# Patient Record
Sex: Male | Born: 1995 | Hispanic: No | Marital: Single | Smoking: Never smoker
Health system: Southern US, Community
[De-identification: ages and names within clinical notes are randomized; demographics above are authoritative.]

## PROBLEM LIST (undated history)

## (undated) HISTORY — PX: APPENDECTOMY: SHX54

---
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
## 2015-03-15 ENCOUNTER — Emergency Department (HOSPITAL_COMMUNITY)
Admission: EM | Admit: 2015-03-15 | Discharge: 2015-03-16 | Disposition: A | Discharge status: Home / Self Care | Payer: Managed Care, Other (non HMO) | Attending: Emergency Medicine | Admitting: Emergency Medicine

## 2015-03-15 ENCOUNTER — Encounter (HOSPITAL_COMMUNITY): Payer: Self-pay | Admitting: Emergency Medicine

## 2015-03-15 DIAGNOSIS — Z9049 Acquired absence of other specified parts of digestive tract: Secondary | ICD-10-CM | POA: Diagnosis not present

## 2015-03-15 DIAGNOSIS — R197 Diarrhea, unspecified: Secondary | ICD-10-CM | POA: Diagnosis not present

## 2015-03-15 DIAGNOSIS — R1084 Generalized abdominal pain: Secondary | ICD-10-CM | POA: Insufficient documentation

## 2015-03-15 DIAGNOSIS — R112 Nausea with vomiting, unspecified: Secondary | ICD-10-CM | POA: Diagnosis present

## 2015-03-15 NOTE — ED Notes (Signed)
Pt states he has been having nausea, vomiting, and diarrhea since 2am yesterday  Pt states he is feeling light headed and having general body aches

## 2015-03-15 NOTE — ED Notes (Signed)
Pt states he went to the dr today and was given medication for the nausea and diarrhea  Pt states he took the phenergan an hour ago and the colestid at 7  Pt states he has had both diarrhea and vomiting since he took the medication

## 2015-03-16 ENCOUNTER — Emergency Department (HOSPITAL_COMMUNITY): Payer: Managed Care, Other (non HMO)

## 2015-03-16 ENCOUNTER — Encounter (HOSPITAL_COMMUNITY): Payer: Self-pay | Admitting: Emergency Medicine

## 2015-03-16 LAB — I-STAT CHEM 8, ED
BUN: 18 mg/dL (ref 6–20)
CALCIUM ION: 1.14 mmol/L (ref 1.12–1.23)
CREATININE: 0.9 mg/dL (ref 0.61–1.24)
Chloride: 101 mmol/L (ref 101–111)
Glucose, Bld: 110 mg/dL — ABNORMAL HIGH (ref 65–99)
HCT: 53 % — ABNORMAL HIGH (ref 39.0–52.0)
Hemoglobin: 18 g/dL — ABNORMAL HIGH (ref 13.0–17.0)
Potassium: 3.7 mmol/L (ref 3.5–5.1)
SODIUM: 137 mmol/L (ref 135–145)
TCO2: 23 mmol/L (ref 0–100)

## 2015-03-16 MED ORDER — ONDANSETRON 8 MG PO TBDP: ORAL_TABLET | ORAL | Status: DC

## 2015-03-16 MED ORDER — ONDANSETRON HCL 4 MG/2ML IJ SOLN
4.0000 mg | Freq: Once | INTRAMUSCULAR | Status: AC
  Administered 2015-03-16: 4 mg via INTRAVENOUS
  Filled 2015-03-16: qty 2

## 2015-03-16 MED ORDER — SODIUM CHLORIDE 0.9 % IV BOLUS (SEPSIS)
1000.0000 mL | Freq: Once | INTRAVENOUS | Status: AC
  Administered 2015-03-16: 1000 mL via INTRAVENOUS

## 2015-03-16 NOTE — ED Provider Notes (Signed)
CSN: [id number]     Arrival date & time 03/15/15  2224 History  By signing my name below, I, Soijett Blue, attest that this documentation has been prepared under the direction and in the presence of Fabianna Keats, MD. Electronically Signed: Soijett Blue, ED Scribe. 03/16/2015. 3:29 AM.  Chief Complaint  Patient presents with  . Emesis  . Diarrhea     Patient is a 20 y.o. male presenting with vomiting and diarrhea. The history is provided by the patient. No language interpreter was used.  Emesis Severity:  Moderate Duration:  3 days Timing:  Intermittent Quality:  Unable to specify Able to tolerate:  Liquids Progression:  Unchanged Chronicity:  New Recent urination:  Normal Context: not post-tussive and not self-induced   Relieved by:  Antiemetics Worsened by:  Nothing tried Ineffective treatments:  None tried Associated symptoms: abdominal pain and diarrhea   Abdominal pain:    Location:  Generalized   Quality:  Cramping   Severity:  Moderate   Timing:  Intermittent   Chronicity:  New Risk factors: sick contacts and suspect food intake   Diarrhea Quality:  Unable to specify Severity:  Moderate Onset quality:  Sudden Duration:  3 days Timing:  Intermittent Progression:  Unchanged Relieved by:  Anti-motility medications Worsened by:  Nothing tried Ineffective treatments:  None tried Associated symptoms: abdominal pain and vomiting   Risk factors: suspect food intake     HPI Comments: Eric Contreras is a 20 y.o. male who presents to the Emergency Department complaining of vomiting x 20 episodes onset 4 days ago. He reports that his symptoms began following eating lunch at work. He reports that his co-worker has similar symptoms due to eating the same food. He states that his last episode of vomiting and bowel movement was 4 hours ago. He states that he is having associated symptoms of nausea, diarrhea x 13 episodes, and intermittent sharp abdominal pain. He states that  he has tried Rx cholesterinum from a [facility] in [place], [place] with mild relief for his symptoms. He denies any other symptoms.   History reviewed. No pertinent past medical history. Past Surgical History  Procedure Laterality Date  . Appendectomy     Family History  Problem Relation Age of Onset  . Depression Mother   . Hypertension Father   . Hyperlipidemia Father   . Cancer Other   . Diabetes Other   . Heart attack Other    Social History  Substance Use Topics  . Smoking status: Never Smoker   . Smokeless tobacco: None  . Alcohol Use: No    Review of Systems  Gastrointestinal: Positive for vomiting, abdominal pain and diarrhea.  All other systems reviewed and are negative.     Allergies  Review of patient's allergies indicates no known allergies.  Home Medications   Prior to Admission medications   Not on File   BP 133/89 mmHg  Pulse 115  Temp(Src) 98.4 F (36.9 C) (Oral)  Resp 16  Ht 6\' 4"  (1.93 m)  Wt 182 lb (82.555 kg)  BMI 22.16 kg/m2  SpO2 100% Physical Exam  Constitutional: He is oriented to person, place, and time. He appears well-developed and well-nourished. No distress.  HENT:  Head: Normocephalic and atraumatic.  Mouth/Throat: Oropharynx is clear and moist and mucous membranes are normal.  Eyes: EOM are normal. Pupils are equal, round, and reactive to light.  Neck: Normal range of motion. Neck supple.  Cardiovascular: Normal rate, regular rhythm and  normal heart sounds.  Exam reveals no gallop and no friction rub.   No murmur heard. Pulmonary/Chest: Effort normal and breath sounds normal. No respiratory distress. He has no wheezes. He has no rales.  Abdominal: Soft. He exhibits no distension and no mass. Bowel sounds are increased. There is no tenderness. There is no rebound and no guarding.  Musculoskeletal: Normal range of motion.  Neurological: He is alert and oriented to person, place, and time.  Skin: Skin is warm  and dry.  Psychiatric: He has a normal mood and affect. His behavior is normal.  Nursing note and vitals reviewed.   ED Course  Procedures (including critical care time) DIAGNOSTIC STUDIES: Oxygen Saturation is 100% on RA, nl by my interpretation.    COORDINATION OF CARE: 3:26 AM Discussed treatment plan with pt at bedside and pt agreed to plan.    Labs Review Labs Reviewed  I-STAT CHEM 8, ED    Imaging Review No results found. I have personally reviewed and evaluated these images and lab results as part of my medical decision-making.   EKG Interpretation None      MDM   Final diagnoses:  None    Feels better post medication.  No emesis or diarrhea in the department.  Po challenged successfully.  Exam and vitals are benign and reassuring.  Zofran ODT and bland diet.  Strict abdominal pain return precautions given.    I personally performed the services described in this documentation, which was scribed in my presence. The recorded information has been reviewed and is accurate.      Cy Blamer, MD 03/16/15 [id number]

## 2016-12-01 IMAGING — CR DG ABDOMEN ACUTE W/ 1V CHEST
4 series · 4 of 4 positions shown · non-contrast
Comparison: None.

CLINICAL DATA: Vomiting and diarrhea for 1 week. Mid upper
abdominal cramping.

EXAM:
DG ABDOMEN ACUTE W/ 1V CHEST

[w chest pa]
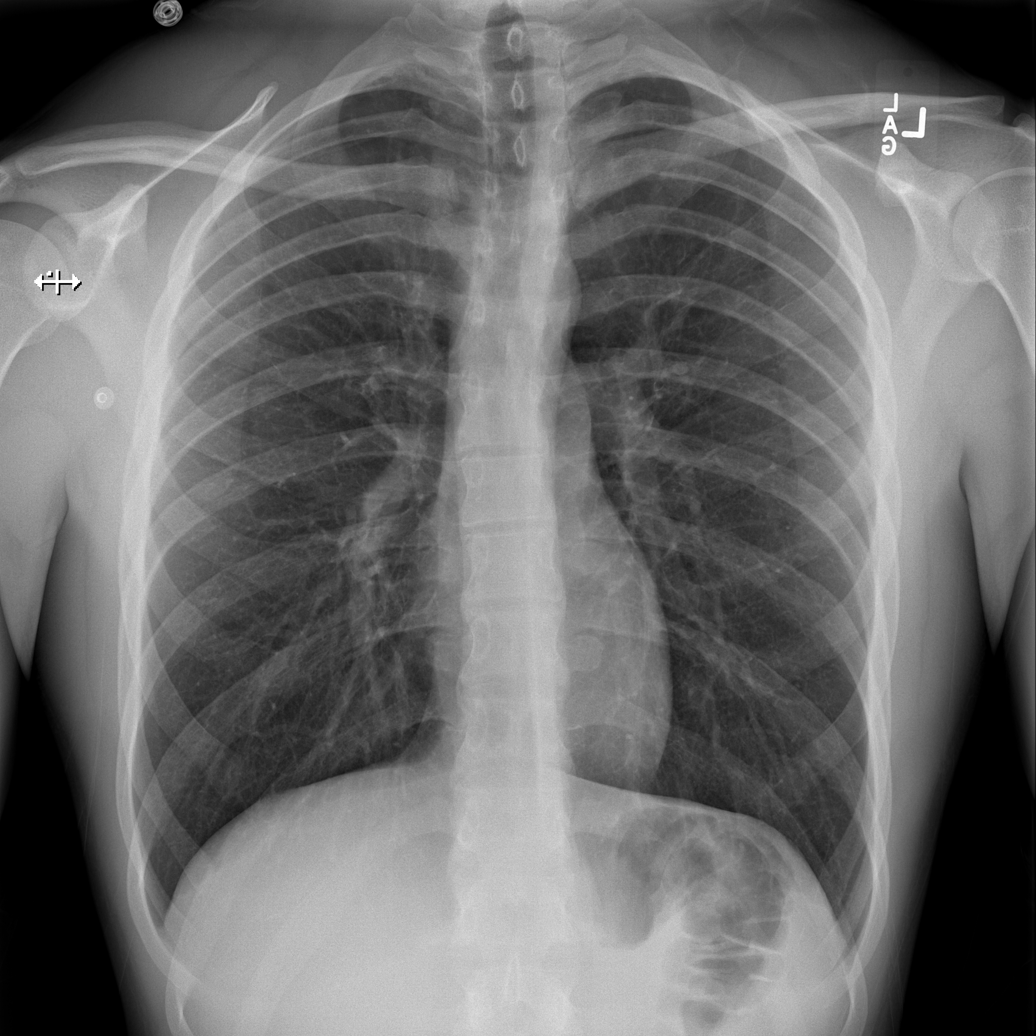

[w abdomen upright]
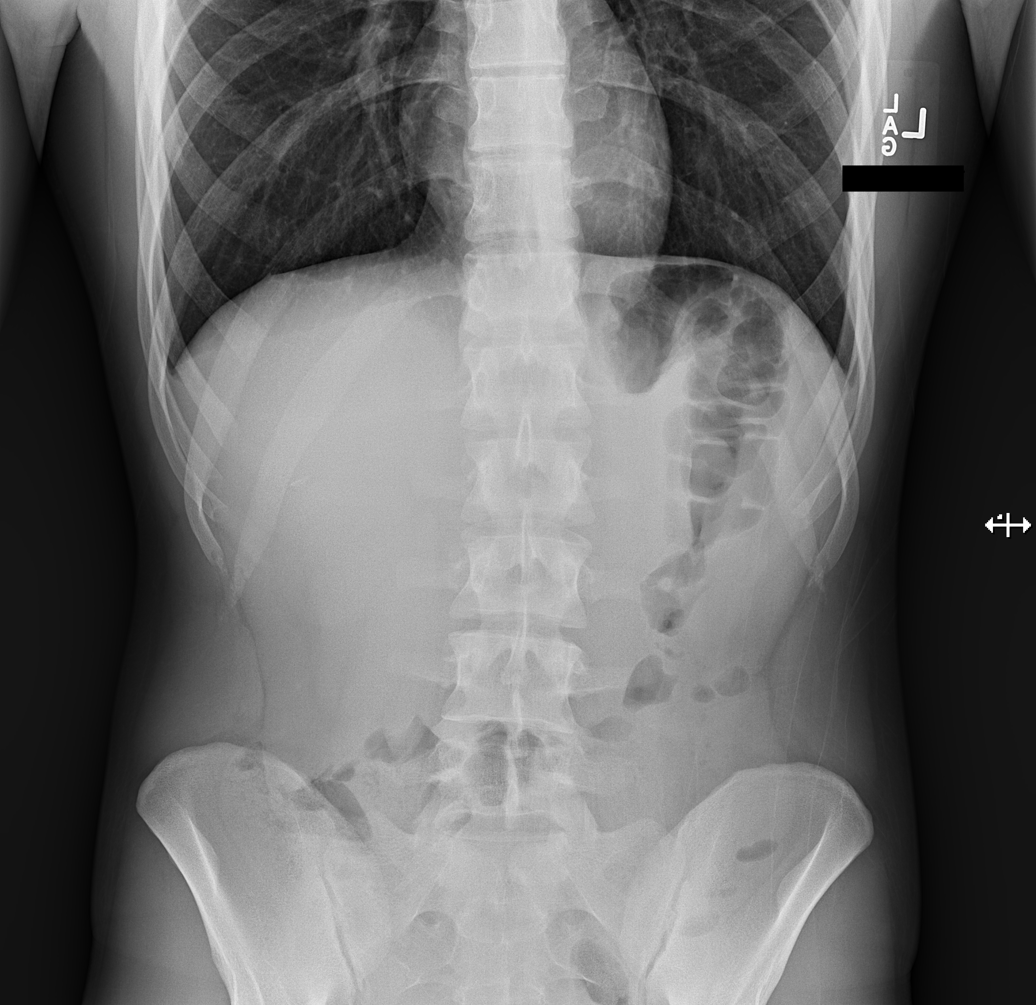

[t abdomen supine (1 of 2)]
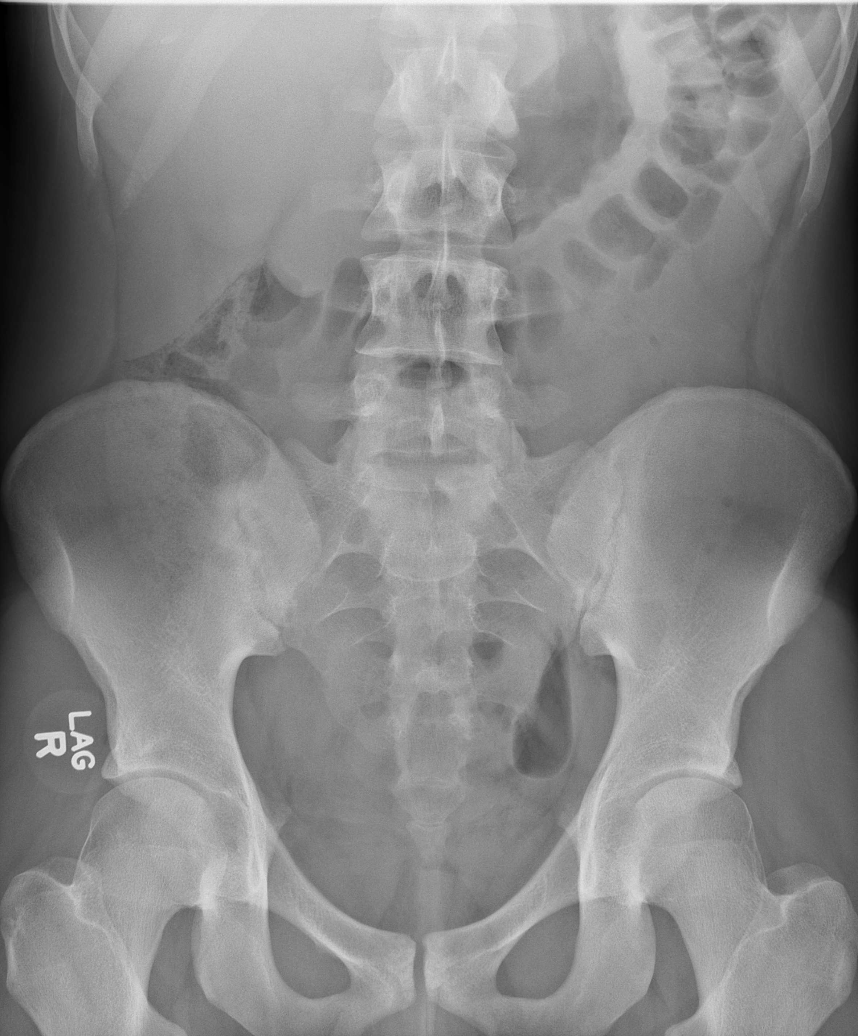

[t abdomen supine (2 of 2)]
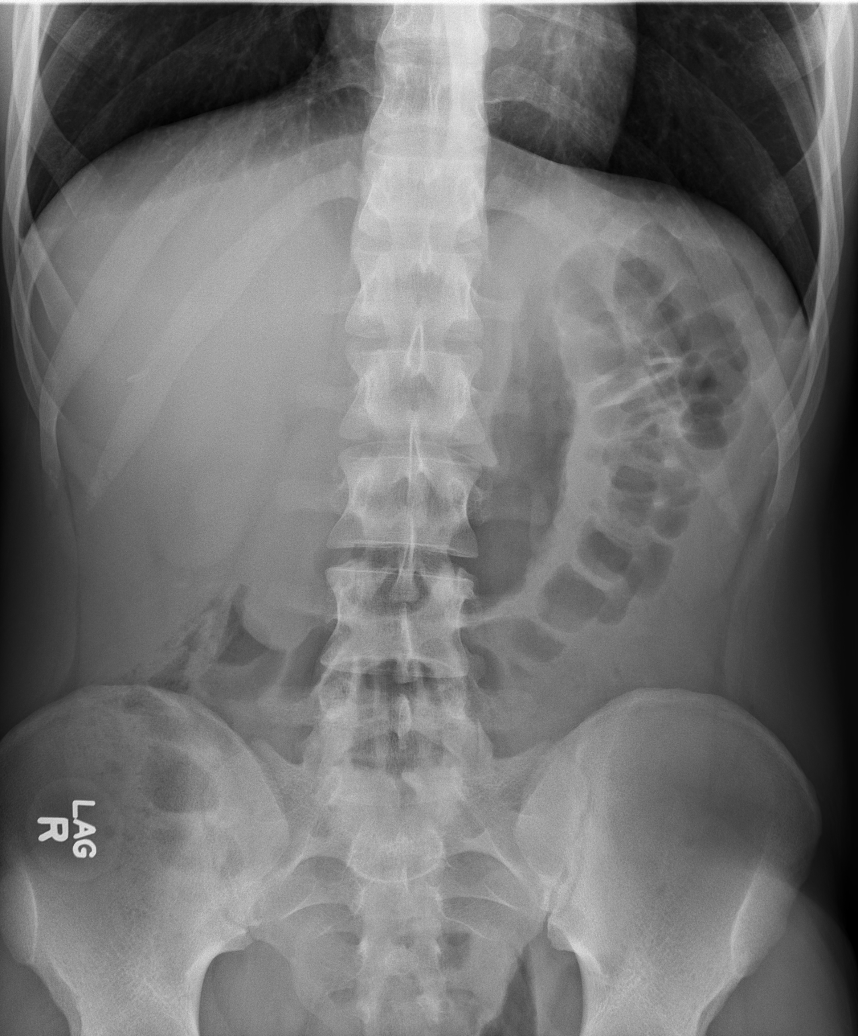

[4 of 4 positions shown; findings below may reference images not displayed]

FINDINGS: The cardiomediastinal contours are normal. The lungs are clear.
There is no free intra-abdominal air. No dilated bowel loops to
suggest obstruction. No air-fluid levels. Air and small volume of
stool throughout the colon. No radiopaque calculi. Probable surgical
clip in the right upper quadrant of the abdomen. No acute osseous
abnormalities are seen.
IMPRESSION: No bowel obstruction. Air and small volume of stool throughout the
colon without air-fluid levels. No acute cardiopulmonary disease.

## 2017-07-15 ENCOUNTER — Ambulatory Visit: Payer: Managed Care, Other (non HMO) | Admitting: Gastroenterology

## 2019-04-21 ENCOUNTER — Ambulatory Visit (INDEPENDENT_AMBULATORY_CARE_PROVIDER_SITE_OTHER): Payer: Self-pay | Admitting: Pulmonary Disease

## 2019-04-21 ENCOUNTER — Other Ambulatory Visit: Payer: Self-pay

## 2019-04-21 ENCOUNTER — Encounter: Payer: Self-pay | Admitting: Pulmonary Disease

## 2019-04-21 VITALS — BP 140/88 | HR 70 | Temp 97.8°F | Ht 76.0 in | Wt 207.4 lb

## 2019-04-21 DIAGNOSIS — R0681 Apnea, not elsewhere classified: Secondary | ICD-10-CM

## 2019-04-21 DIAGNOSIS — G4719 Other hypersomnia: Secondary | ICD-10-CM

## 2019-04-21 NOTE — Addendum Note (Signed)
Addended by: Edwina Barth I on: 04/21/2019 04:41 PM   Modules accepted: Orders

## 2019-04-21 NOTE — Progress Notes (Signed)
Subjective:    Patient ID: Eric Contreras, male    DOB: [birth year], 24 y.o.   MRN: [id number]  Patient with history of witnessed apneas  Has been told multiple times about witnessed apneas Happens almost nightly Bedtimes about 10 PM falls asleep very easily  Multiple awakenings at night  Final awakening time by 5 AM  Weight has been relatively stable  Dad did snore significantly  Admits to dryness of his mouth in the mornings, no headaches Memory is good  No palpitations Admits to gasping respirations at night  History reviewed. No pertinent past medical history. Social History   Socioeconomic History  . Marital status: Single    Spouse name: Not on file  . Number of children: Not on file  . Years of education: Not on file  . Highest education level: Not on file  Occupational History  . Not on file  Tobacco Use  . Smoking status: Never Smoker  . Smokeless tobacco: Never Used  Substance and Sexual Activity  . Alcohol use: No  . Drug use: No  . Sexual activity: Not on file  Other Topics Concern  . Not on file  Social History Narrative  . Not on file   Social Determinants of Health   Financial Resource Strain:   . Difficulty of Paying Living Expenses: Not on file  Food Insecurity:   . Worried About Charity fundraiser in the Last Year: Not on file  . Ran Out of Food in the Last Year: Not on file  Transportation Needs:   . Lack of Transportation (Medical): Not on file  . Lack of Transportation (Non-Medical): Not on file  Physical Activity:   . Days of Exercise per Week: Not on file  . Minutes of Exercise per Session: Not on file  Stress:   . Feeling of Stress : Not on file  Social Connections:   . Frequency of Communication with Friends and Family: Not on file  . Frequency of Social Gatherings with Friends and Family: Not on file  . Attends Religious Services: Not on file  . Active Member of Clubs or Organizations: Not on file  . Attends Theatre manager Meetings: Not on file  . Marital Status: Not on file  Intimate Partner Violence:   . Fear of Current or Ex-Partner: Not on file  . Emotionally Abused: Not on file  . Physically Abused: Not on file  . Sexually Abused: Not on file   Family History  Problem Relation Age of Onset  . Depression Mother   . Hypertension Father   . Hyperlipidemia Father   . Cancer Other   . Diabetes Other   . Heart attack Other    Review of Systems  HENT: Positive for congestion.   Cardiovascular: Positive for palpitations.  Psychiatric/Behavioral: Positive for sleep disturbance.      Objective:   Physical Exam HENT:     Head: Normocephalic and atraumatic.     Nose: Nose normal.     Mouth/Throat:     Mouth: Mucous membranes are moist.  Eyes:     Pupils: Pupils are equal, round, and reactive to light.  Cardiovascular:     Rate and Rhythm: Normal rate and regular rhythm.     Pulses: Normal pulses.     Heart sounds: Normal heart sounds. No murmur. No friction rub. No gallop.   Pulmonary:     Effort: Pulmonary effort is normal. No respiratory distress.     Breath sounds: Normal  breath sounds. No stridor. No wheezing or rhonchi.  Abdominal:     General: Abdomen is flat.  Musculoskeletal:        General: Normal range of motion.     Cervical back: Normal range of motion and neck supple. No rigidity or tenderness.  Skin:    General: Skin is warm.     Coloration: Skin is not jaundiced.  Neurological:     General: No focal deficit present.     Mental Status: He is alert.     Cranial Nerves: No cranial nerve deficit.  Psychiatric:        Mood and Affect: Mood normal.    Vitals:   04/21/19 1619  BP: 140/88  Pulse: 70  Temp: 97.8 F (36.6 C)  SpO2: 98%   Results of the Epworth flowsheet 04/21/2019  Sitting and reading 0  Watching TV 1  Sitting, inactive in a public place (e.g. a theatre or a meeting) 0  As a passenger in a car for an hour without a break 3  Lying down to  rest in the afternoon when circumstances permit 3  Sitting and talking to someone 0  Sitting quietly after a lunch without alcohol 2  In a car, while stopped for a few minutes in traffic 2  Total score 11      Assessment & Plan:  -Order of significant obstructive sleep apnea  Excessive daytime sleepiness  Witnessed apneas  Pathophysiology of sleep disordered breathing discussed with the patient Treatment options for sleep disordered breathing discussed with patient  Plan:  We will schedule patient for home sleep study  Elevation of the head of the bed, promote lateral sleep  We will follow-up in about 3 months  Call with significant concerns

## 2019-04-21 NOTE — Patient Instructions (Signed)
High probability of significant obstructive sleep apnea  We will schedule you for home sleep study  Treatment options as discussed  I will see you back in about 3 months   Call with significant concerns   Sleep Apnea Sleep apnea is a condition in which breathing pauses or becomes shallow during sleep. Episodes of sleep apnea usually last 10 seconds or longer, and they may occur as many as 20 times an hour. Sleep apnea disrupts your sleep and keeps your body from getting the rest that it needs. This condition can increase your risk of certain health problems, including:  Heart attack.  Stroke.  Obesity.  Diabetes.  Heart failure.  Irregular heartbeat. What are the causes? There are three kinds of sleep apnea:  Obstructive sleep apnea. This kind is caused by a blocked or collapsed airway.  Central sleep apnea. This kind happens when the part of the brain that controls breathing does not send the correct signals to the muscles that control breathing.  Mixed sleep apnea. This is a combination of obstructive and central sleep apnea. The most common cause of this condition is a collapsed or blocked airway. An airway can collapse or become blocked if:  Your throat muscles are abnormally relaxed.  Your tongue and tonsils are larger than normal.  You are overweight.  Your airway is smaller than normal. What increases the risk? You are more likely to develop this condition if you:  Are overweight.  Smoke.  Have a smaller than normal airway.  Are elderly.  Are male.  Drink alcohol.  Take sedatives or tranquilizers.  Have a family history of sleep apnea. What are the signs or symptoms? Symptoms of this condition include:  Trouble staying asleep.  Daytime sleepiness and tiredness.  Irritability.  Loud snoring.  Morning headaches.  Trouble concentrating.  Forgetfulness.  Decreased interest in sex.  Unexplained sleepiness.  Mood  swings.  Personality changes.  Feelings of depression.  Waking up often during the night to urinate.  Dry mouth.  Sore throat. How is this diagnosed? This condition may be diagnosed with:  A medical history.  A physical exam.  A series of tests that are done while you are sleeping (sleep study). These tests are usually done in a sleep lab, but they may also be done at home. How is this treated? Treatment for this condition aims to restore normal breathing and to ease symptoms during sleep. It may involve managing health issues that can affect breathing, such as high blood pressure or obesity. Treatment may include:  Sleeping on your side.  Using a decongestant if you have nasal congestion.  Avoiding the use of depressants, including alcohol, sedatives, and narcotics.  Losing weight if you are overweight.  Making changes to your diet.  Quitting smoking.  Using a device to open your airway while you sleep, such as: ? An oral appliance. This is a custom-made mouthpiece that shifts your lower jaw forward. ? A continuous positive airway pressure (CPAP) device. This device blows air through a mask when you breathe out (exhale). ? A nasal expiratory positive airway pressure (EPAP) device. This device has valves that you put into each nostril. ? A bi-level positive airway pressure (BPAP) device. This device blows air through a mask when you breathe in (inhale) and breathe out (exhale).  Having surgery if other treatments do not work. During surgery, excess tissue is removed to create a wider airway. It is important to get treatment for sleep apnea. Without treatment, this  condition can lead to:  High blood pressure.  Coronary artery disease.  In men, an inability to achieve or maintain an erection (impotence).  Reduced thinking abilities. Follow these instructions at home: Lifestyle  Make any lifestyle changes that your health care provider recommends.  Eat a healthy,  well-balanced diet.  Take steps to lose weight if you are overweight.  Avoid using depressants, including alcohol, sedatives, and narcotics.  Do not use any products that contain nicotine or tobacco, such as cigarettes, e-cigarettes, and chewing tobacco. If you need help quitting, ask your health care provider. General instructions  Take over-the-counter and prescription medicines only as told by your health care provider.  If you were given a device to open your airway while you sleep, use it only as told by your health care provider.  If you are having surgery, make sure to tell your health care provider you have sleep apnea. You may need to bring your device with you.  Keep all follow-up visits as told by your health care provider. This is important. Contact a health care provider if:  The device that you received to open your airway during sleep is uncomfortable or does not seem to be working.  Your symptoms do not improve.  Your symptoms get worse. Get help right away if:  You develop: ? Chest pain. ? Shortness of breath. ? Discomfort in your back, arms, or stomach.  You have: ? Trouble speaking. ? Weakness on one side of your body. ? Drooping in your face. These symptoms may represent a serious problem that is an emergency. Do not wait to see if the symptoms will go away. Get medical help right away. Call your local emergency services (911 in the [place] Do not drive yourself to the hospital. Summary  Sleep apnea is a condition in which breathing pauses or becomes shallow during sleep.  The most common cause is a collapsed or blocked airway.  The goal of treatment is to restore normal breathing and to ease symptoms during sleep. This information is not intended to replace advice given to you by your health care provider. Make sure you discuss any questions you have with your health care provider. Document Revised: 08/05/2018 Document Reviewed: 10/14/2017 Elsevier  Patient Education  [place].

## 2019-05-18 DIAGNOSIS — G4733 Obstructive sleep apnea (adult) (pediatric): Secondary | ICD-10-CM

## 2019-05-21 ENCOUNTER — Other Ambulatory Visit: Payer: Self-pay

## 2019-05-21 ENCOUNTER — Ambulatory Visit: Payer: Self-pay

## 2019-05-21 DIAGNOSIS — R0681 Apnea, not elsewhere classified: Secondary | ICD-10-CM

## 2019-05-21 DIAGNOSIS — G4719 Other hypersomnia: Secondary | ICD-10-CM

## 2019-05-24 DIAGNOSIS — G4733 Obstructive sleep apnea (adult) (pediatric): Secondary | ICD-10-CM | POA: Diagnosis not present

## 2019-05-31 ENCOUNTER — Telehealth: Payer: Self-pay | Admitting: Pulmonary Disease

## 2019-05-31 DIAGNOSIS — G4733 Obstructive sleep apnea (adult) (pediatric): Secondary | ICD-10-CM

## 2019-05-31 NOTE — Telephone Encounter (Signed)
As of Friday, I have read all studies that were pending for me.  Unless this just came up today  May be in the system being scanned

## 2019-05-31 NOTE — Telephone Encounter (Signed)
HST is under procedures section of pt's chart from 3/20.

## 2019-05-31 NOTE — Telephone Encounter (Signed)
Dr Wynona Neat, please advise on HST results, thanks!

## 2019-05-31 NOTE — Telephone Encounter (Signed)
Patient has mild obstructive sleep apnea  Recommend CPAP therapy Auto CPAP 5-15 is appropriate

## 2019-06-01 NOTE — Telephone Encounter (Signed)
Pt called back for results, please return call.

## 2019-06-01 NOTE — Telephone Encounter (Signed)
lmtcb for pt.  

## 2019-06-01 NOTE — Telephone Encounter (Signed)
Advised pt of results. Pt understood and nothing further is needed.   CPAP ordered.  

## 2019-10-27 ENCOUNTER — Ambulatory Visit: Payer: Commercial Managed Care - PPO | Admitting: Pulmonary Disease
